# Patient Record
Sex: Female | Born: 1980 | Race: Black or African American | Hispanic: No | Marital: Single | Smoking: Never smoker
Health system: Southern US, Community
[De-identification: ages and names within clinical notes are randomized; demographics above are authoritative.]

## PROBLEM LIST (undated history)

## (undated) DIAGNOSIS — J45909 Unspecified asthma, uncomplicated: Secondary | ICD-10-CM

## (undated) DIAGNOSIS — L732 Hidradenitis suppurativa: Secondary | ICD-10-CM

## (undated) DIAGNOSIS — F32A Depression, unspecified: Secondary | ICD-10-CM

## (undated) DIAGNOSIS — B009 Herpesviral infection, unspecified: Secondary | ICD-10-CM

## (undated) DIAGNOSIS — T7840XA Allergy, unspecified, initial encounter: Secondary | ICD-10-CM

## (undated) DIAGNOSIS — F329 Major depressive disorder, single episode, unspecified: Secondary | ICD-10-CM

## (undated) HISTORY — DX: Herpesviral infection, unspecified: B00.9

## (undated) HISTORY — DX: Hidradenitis suppurativa: L73.2

## (undated) HISTORY — DX: Unspecified asthma, uncomplicated: J45.909

## (undated) HISTORY — DX: Allergy, unspecified, initial encounter: T78.40XA

## (undated) HISTORY — DX: Major depressive disorder, single episode, unspecified: F32.9

## (undated) HISTORY — PX: WISDOM TOOTH EXTRACTION: SHX21

## (undated) HISTORY — DX: Depression, unspecified: F32.A

## (undated) HISTORY — PX: OTHER SURGICAL HISTORY: SHX169

---

## 2004-08-13 ENCOUNTER — Emergency Department (HOSPITAL_COMMUNITY): Admission: EM | Admit: 2004-08-13 | Discharge: 2004-08-14 | Payer: Self-pay | Admitting: Emergency Medicine

## 2004-08-20 ENCOUNTER — Ambulatory Visit (HOSPITAL_COMMUNITY): Admission: RE | Admit: 2004-08-20 | Discharge: 2004-08-20 | Payer: Self-pay | Admitting: Orthopedic Surgery

## 2004-08-20 ENCOUNTER — Ambulatory Visit (HOSPITAL_BASED_OUTPATIENT_CLINIC_OR_DEPARTMENT_OTHER): Admission: RE | Admit: 2004-08-20 | Discharge: 2004-08-20 | Payer: Self-pay | Admitting: Orthopedic Surgery

## 2004-10-29 ENCOUNTER — Ambulatory Visit (HOSPITAL_COMMUNITY): Admission: RE | Admit: 2004-10-29 | Discharge: 2004-10-29 | Payer: Self-pay | Admitting: Orthopedic Surgery

## 2004-10-29 ENCOUNTER — Ambulatory Visit (HOSPITAL_BASED_OUTPATIENT_CLINIC_OR_DEPARTMENT_OTHER): Admission: RE | Admit: 2004-10-29 | Discharge: 2004-10-29 | Payer: Self-pay | Admitting: Orthopedic Surgery

## 2005-01-13 ENCOUNTER — Other Ambulatory Visit: Admission: RE | Admit: 2005-01-13 | Discharge: 2005-01-13 | Payer: Self-pay | Admitting: Obstetrics & Gynecology

## 2011-10-16 ENCOUNTER — Other Ambulatory Visit: Payer: Self-pay | Admitting: Obstetrics and Gynecology

## 2011-10-22 ENCOUNTER — Telehealth: Payer: Self-pay | Admitting: Obstetrics and Gynecology

## 2011-10-22 NOTE — Telephone Encounter (Signed)
VM from pt.  States was taking ATB.  Gets frequent yeast infections. Had Rx for Diflucan but no RF.  Requesting RX.

## 2011-10-22 NOTE — Telephone Encounter (Signed)
Spoke with pt rgd msg pt states take abx before cycle pt states having yeast infection wants rx for diflucan advised pt will consult with vph and call her back pt voice understanding

## 2011-10-22 NOTE — Telephone Encounter (Signed)
VM from pt.

## 2011-10-23 NOTE — Telephone Encounter (Signed)
Per conversation at [facility], Diflucan 150 mg po stat x1 prn antibiotic use for hidradenitis.  Dispense #3 with 3 refills

## 2011-10-26 DIAGNOSIS — B009 Herpesviral infection, unspecified: Secondary | ICD-10-CM | POA: Insufficient documentation

## 2011-10-26 DIAGNOSIS — J45909 Unspecified asthma, uncomplicated: Secondary | ICD-10-CM | POA: Insufficient documentation

## 2011-10-26 DIAGNOSIS — L732 Hidradenitis suppurativa: Secondary | ICD-10-CM | POA: Insufficient documentation

## 2011-10-26 MED ORDER — FLUCONAZOLE 150 MG PO TABS: 150.0000 mg | ORAL_TABLET | Freq: Once | ORAL | Status: DC

## 2011-10-26 NOTE — Addendum Note (Signed)
Addended by: Rolla Plate on: 10/26/2011 10:00 AM   Modules accepted: Orders

## 2011-10-26 NOTE — Telephone Encounter (Signed)
Spoke with pt rgd previous call informed pt rx sent to pharm pt voice understanding

## 2012-08-30 DIAGNOSIS — Z9109 Other allergy status, other than to drugs and biological substances: Secondary | ICD-10-CM | POA: Insufficient documentation

## 2013-10-06 DIAGNOSIS — Z113 Encounter for screening for infections with a predominantly sexual mode of transmission: Secondary | ICD-10-CM | POA: Insufficient documentation

## 2013-10-06 DIAGNOSIS — Z124 Encounter for screening for malignant neoplasm of cervix: Secondary | ICD-10-CM | POA: Insufficient documentation

## 2013-10-06 DIAGNOSIS — Z23 Encounter for immunization: Secondary | ICD-10-CM | POA: Insufficient documentation

## 2016-03-09 ENCOUNTER — Ambulatory Visit (INDEPENDENT_AMBULATORY_CARE_PROVIDER_SITE_OTHER): Payer: BLUE CROSS/BLUE SHIELD | Admitting: Family Medicine

## 2016-03-09 ENCOUNTER — Encounter: Payer: Self-pay | Admitting: Family Medicine

## 2016-03-09 VITALS — BP 120/70 | HR 82 | Resp 12 | Ht 68.0 in | Wt 192.0 lb

## 2016-03-09 DIAGNOSIS — Z23 Encounter for immunization: Secondary | ICD-10-CM | POA: Diagnosis not present

## 2016-03-09 DIAGNOSIS — Z13220 Encounter for screening for lipoid disorders: Secondary | ICD-10-CM

## 2016-03-09 DIAGNOSIS — Z0000 Encounter for general adult medical examination without abnormal findings: Secondary | ICD-10-CM | POA: Diagnosis not present

## 2016-03-09 DIAGNOSIS — Z131 Encounter for screening for diabetes mellitus: Secondary | ICD-10-CM | POA: Diagnosis not present

## 2016-03-09 NOTE — Patient Instructions (Signed)
A few things to remember from today's visit:   Physical exam, annual  Screening for lipid disorders - Plan: Lipid panel, Basic metabolic panel  Diabetes mellitus screening - Plan: Lipid panel, Basic metabolic panel    At least 150 minutes of moderate exercise per week, daily brisk walking for 15-30 min is a good exercise option. Healthy diet low in saturated (animal) fats and sweets and consisting of fresh fruits and vegetables, lean meats such as fish and white chicken and whole grains.   - Vaccines:  Tdap vaccine every 10 years.  Shingles vaccine recommended at age 36, could be given after 36 years of age but not sure about insurance coverage.  Pneumonia vaccines:  Prevnar 13 at 65 and Pneumovax at 66.  Screening recommendations for low/normal risk women:  Screening for diabetes at age 10240-45 and every 3 years.  Cervical cancer prevention:  -HPV vaccination between 349-239 years old. -Pap smear starts at 36 years of age and continues periodically until 36 years old in low risk women. Pap smear every 3 years between 7221 and 36 years old. Pap smear every 3 years between women 30 and older if pap smear negative and HPV screening negative.   -Breast cancer: Mammogram: There is disagreement between experts about when to start screening in low risk asymptomatic female but recent recommendations are to start screening at 440 and not later than 36 years old , every 1-2 years and after 36 yo q 2 years. Screening is recommended until 36 years old but some women can continue screening depending of healthy issues.   Colon cancer screening: starts at 36 years old until 36 years old.  Cholesterol disorder screening at age 36 and every 3 years.    Please be sure medication list is accurate. If a new problem present, please set up appointment sooner than planned today.

## 2016-03-09 NOTE — Progress Notes (Signed)
Pre visit review using our clinic review tool, if applicable. No additional management support is needed unless otherwise documented below in the visit note. 

## 2016-03-09 NOTE — Progress Notes (Addendum)
HPI:   [name] is a 36 y.o. female, who is here today with her 2 sons to establish Strong and for routine physical.  Former PCP: [facility].Dr [facility]. Last preventive routine visit: 2 years ago.  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 Also Hx of allergies,she follows with immunologists and gets allergy shot weekly. She also has Hx of knee pain,L>R , and left ankle pain.She follows with ortho and planning on having arthroscopy left knee in the next few weeks.   She follows with gyn annually for her female preventive Strong,last seen 08/2015.  She plays volleyball once per week for about 3 hours. She is not consistent with a healthy diet. She lives with her 2 sons (57 and 36 years old) and 2 cousins.  Reporting HIV test done in 2015.  Concerns today: Vaccination. She needs influenza vaccine, she thinks her Tdap is up to date but not sure about date.     Review of Systems  Constitutional: Negative for appetite change, fatigue and fever.  HENT: Positive for congestion and rhinorrhea. Negative for dental problem, hearing loss, mouth sores, trouble swallowing and voice change.   Eyes: Negative for photophobia, pain and visual disturbance.  Respiratory: Negative for cough, shortness of breath and wheezing.   Cardiovascular: Negative for chest pain and leg swelling.  Gastrointestinal: Negative for abdominal pain, nausea and vomiting.       No changes in bowel habits.  Endocrine: Negative for cold intolerance, heat intolerance, polydipsia, polyphagia and polyuria.  Genitourinary: Negative for decreased urine volume, dysuria and hematuria.  Musculoskeletal: Positive for arthralgias (knees and ankle (L)). Negative for gait problem and neck pain.  Skin: Negative for color change and rash.  Allergic/Immunologic: Positive for environmental allergies.  Neurological: Negative for  syncope, weakness and headaches.  Hematological: Negative for adenopathy. Does not bruise/bleed easily.  Psychiatric/Behavioral: Negative for confusion and sleep disturbance. The patient is not nervous/anxious.   All other systems reviewed and are negative.     No current outpatient prescriptions on file prior to visit.   No current facility-administered medications on file prior to visit.      Past Medical History:  Diagnosis Date  . Allergy   . Asthma   . Depression   . Herpes simplex type 1 infection   . Hydradenitis    No Known Allergies  Family History  Problem Relation Age of Onset  . Hypertension Mother   . Hypertension Father   . Cancer Father     Lymphoma    Social History   Social History  . Marital status: Single    Spouse name: N/A  . Number of children: N/A  . Years of education: N/A   Social History Main Topics  . Smoking status: Never Smoker  . Smokeless tobacco: Never Used  . Alcohol use Yes  . Drug use: No  . Sexual activity: Not Asked   Other Topics Concern  . None   Social History Narrative  . None    Vitals:   03/09/16 1510  BP: 120/70  Pulse: 82  Resp: 12   O2 sat 98% at RA. Body mass index is 29.19 kg/m.   Physical Exam  Nursing note and vitals reviewed. Constitutional: She is oriented to person, place, and time. She appears well-developed. No distress.  HENT:  Head: Atraumatic.  Right Ear: Hearing, tympanic membrane, external ear and ear canal normal.  Left Ear: Hearing, tympanic membrane, external ear and ear canal normal.  Mouth/Throat: Uvula is midline, oropharynx is clear and moist and mucous membranes are normal.  Tonsil stone right tonsil.  Eyes: Conjunctivae and EOM are normal. Pupils are equal, round, and reactive to light.  Neck: No tracheal deviation present. No thyroid mass and no thyromegaly present.  Cardiovascular: Normal rate and regular rhythm.   No murmur heard. Pulses:      Dorsalis pedis pulses  are 2+ on the right side, and 2+ on the left side.  Respiratory: Effort normal and breath sounds normal. No respiratory distress.  GI: Soft. She exhibits no mass. There is no hepatomegaly. There is no tenderness.  Musculoskeletal: She exhibits no edema.  No major deformity or signs of synovitis appreciated. Knee with normal ROM, no pain elicited and no crepitus.  Lymphadenopathy:    She has no cervical adenopathy.       Right: No supraclavicular adenopathy present.       Left: No supraclavicular adenopathy present.  Neurological: She is alert and oriented to person, place, and time. She has normal strength. No cranial nerve deficit or sensory deficit. Coordination and gait normal.  Reflex Scores:      Bicep reflexes are 2+ on the right side and 2+ on the left side.      Patellar reflexes are 2+ on the right side and 2+ on the left side. Skin: Skin is warm. No rash noted. No erythema.  Tattoos different parts of her body.   Psychiatric: She has a normal mood and affect. Her speech is normal. Cognition and memory are normal.  Well groomed, good eye contact.     ASSESSMENT AND PLAN:   Lisa Strong was seen today for establish Strong.  Diagnoses and all orders for this visit:  Physical exam, annual   We discussed the importance of regular physical activity and healthy diet for prevention of chronic illness and/or complications. Preventive guidelines reviewed. Vaccination updated. Continue following with gyn for her routine female preventive Strong. At the time of this visit I have not received records from former PCP. Next CPE in 1-2 years.   Screening for lipid disorders -     Lipid panel; Future -     Basic metabolic panel; Future  Diabetes mellitus screening -     Lipid panel; Future -     Basic metabolic panel; Future  Need for prophylactic vaccination and inoculation against influenza -     Flu Vaccine QUAD 36+ mos IM       Lisa Strong. [place], MD  Lisa Strong. Lisa Strong.

## 2016-03-10 LAB — LIPID PANEL
CHOLESTEROL: 179 mg/dL (ref 0–200)
HDL: 52.6 mg/dL (ref >39.00)
LDL Cholesterol: 115 mg/dL — ABNORMAL HIGH (ref 0–99)
NonHDL: 126.55
TRIGLYCERIDES: 59 mg/dL (ref 0.0–149.0)
Total CHOL/HDL Ratio: 3
VLDL: 11.8 mg/dL (ref 0.0–40.0)

## 2016-03-10 LAB — BASIC METABOLIC PANEL
BUN: 11 mg/dL (ref 6–23)
CO2: 29 meq/L (ref 19–32)
Calcium: 8.9 mg/dL (ref 8.4–10.5)
Chloride: 104 mEq/L (ref 96–112)
Creatinine, Ser: 0.69 mg/dL (ref 0.40–1.20)
GFR: 124.16 mL/min (ref >60.00)
GLUCOSE: 94 mg/dL (ref 70–99)
POTASSIUM: 3.9 meq/L (ref 3.5–5.1)
SODIUM: 140 meq/L (ref 135–145)

## 2016-03-10 NOTE — Addendum Note (Signed)
Addended by: Bonnye FavaKWEI, NANA K on: 03/10/2016 08:23 AM   Modules accepted: Orders

## 2016-03-12 ENCOUNTER — Encounter: Payer: Self-pay | Admitting: Family Medicine

## 2016-06-03 ENCOUNTER — Ambulatory Visit (INDEPENDENT_AMBULATORY_CARE_PROVIDER_SITE_OTHER): Payer: BLUE CROSS/BLUE SHIELD | Admitting: Sports Medicine

## 2016-06-03 ENCOUNTER — Encounter: Payer: Self-pay | Admitting: Sports Medicine

## 2016-06-03 DIAGNOSIS — M25579 Pain in unspecified ankle and joints of unspecified foot: Secondary | ICD-10-CM

## 2016-06-05 NOTE — Progress Notes (Signed)
   Subjective:    Patient ID: Lisa Strong, female    DOB: 07/03/1980, 36 y.o.   MRN: [id number]  HPI chief complaint: Bilateral foot pain  36 year old female comes in today complaining of long-standing bilateral foot pain. She has a history of plantar fasciitis. She localizes her pain primarily to the arch of each foot. She denies any heel pain. No recent trauma. No numbness or tingling. She is here today to discuss orthotics.  Past medical history reviewed Medications reviewed Allergies reviewed     Review of Systems As above     Objective:   Physical Exam  Well-developed, well-nourished. No acute distress. Awake alert and oriented 3. Vital signs reviewed  Examination of both feet in the standing position shows mild pes planus. She is slightly tender to palpation diffusely along the arch of her foot but no tenderness to palpation at the calcaneus. Negative calcaneal squeeze. Negative tinels over the tarsal tunnel. No soft tissue swelling. Neurovascularly intact distally. Walking without a limp.      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 I've given her an arch strap to try as well. Follow-up in one month. If she finds the green sports insoles to be comfortable then we will consider custom orthotics at that time.

## 2016-06-22 ENCOUNTER — Encounter: Payer: Self-pay | Admitting: Family Medicine

## 2016-06-22 ENCOUNTER — Ambulatory Visit (INDEPENDENT_AMBULATORY_CARE_PROVIDER_SITE_OTHER): Payer: BLUE CROSS/BLUE SHIELD | Admitting: Family Medicine

## 2016-06-22 VITALS — BP 130/80 | HR 81 | Temp 98.1°F | Resp 12 | Ht 68.0 in | Wt 192.1 lb

## 2016-06-22 DIAGNOSIS — J069 Acute upper respiratory infection, unspecified: Secondary | ICD-10-CM

## 2016-06-22 DIAGNOSIS — H6983 Other specified disorders of Eustachian tube, bilateral: Secondary | ICD-10-CM

## 2016-06-22 DIAGNOSIS — J4520 Mild intermittent asthma, uncomplicated: Secondary | ICD-10-CM | POA: Diagnosis not present

## 2016-06-22 DIAGNOSIS — J309 Allergic rhinitis, unspecified: Secondary | ICD-10-CM | POA: Insufficient documentation

## 2016-06-22 MED ORDER — METHYLPREDNISOLONE ACETATE 80 MG/ML IJ SUSP
40.0000 mg | Freq: Once | INTRAMUSCULAR | Status: AC
  Administered 2016-06-22: 40 mg via INTRAMUSCULAR

## 2016-06-22 MED ORDER — ALBUTEROL SULFATE HFA 108 (90 BASE) MCG/ACT IN AERS: 2.0000 | INHALATION_SPRAY | Freq: Four times a day (QID) | RESPIRATORY_TRACT | 0 refills | Status: DC | PRN

## 2016-06-22 MED ORDER — PREDNISONE 20 MG PO TABS: 40.0000 mg | ORAL_TABLET | Freq: Every day | ORAL | 0 refills | Status: AC

## 2016-06-22 NOTE — Patient Instructions (Addendum)
A few things to remember from today's visit:   Mild intermittent asthma, unspecified whether complicated - Plan: albuterol (PROVENTIL HFA;VENTOLIN HFA) 108 (90 Base) MCG/ACT inhaler  Allergic rhinitis, unspecified seasonality, unspecified trigger - Plan: predniSONE (DELTASONE) 20 MG tablet  Eustachian tube dysfunction, bilateral   Allegra 180 mg daily in the morning. Start Prednisone tomorrow.  Nasal irrigations with saline.  Please follow in 2 weeks of not any better.  Albuterol inh 2 puff every 6 hours for a week then as needed for wheezing or shortness of breath.   There are 2 forms of allergic rhinitis:    . Seasonal (hay fever): Caused by an allergy to pollen and/or mold spores in the air. Pollen is the fine powder that comes from the stamen of flowering plants. It can be carried through the air and is easily inhaled. Symptoms are seasonal and usually occur in spring, late summer, and fall. Marland Kitchen. Perennial: Caused by other allergens such as dust mites, pet hair or dander, or mold. Symptoms occur year-round.  Symptoms: Your symptoms can vary, depending on the severity of your allergies. Symptoms can include: Sneezing, coughing.itching (mostly eyes, nose, mouth, throat and skin),runny nose,stuffy nose.headache,pressure in the nose and cheeks,ear fullness and popping, sore throat.watery, red, or swollen eyes,dark circles under your eyes,trouble smelling, and sometimes hives.  Allergic rhinitis cannot be prevented. You can help your symptoms by avoiding the things that you are allergic, including: . Keeping windows closed. This is especially important during high-pollen seasons. . Washing your hands after petting animals. . Using dust- and mite-proof bedding and mattress covers. . Wearing glasses outside to protect your eyes. . Showering before bed to wash off allergens from hair and skin. You can also avoid things that can make your symptoms worse, such as: . aerosol sprays . air  pollution . cold temperatures . humidity . irritating fumes . tobacco smoke . wind . wood smoke.   Antihistamines help reduce the sneezing, runny nose, and itchiness of allergies. These come in pill form and as nasal sprays. Allegra,Zyrtec,or Claritin are some examples. Decongestants, such as pseudoephedrine and phenylephrine, help temporarily relieve the stuffy nose of allergies. Decongestants are found in many medicines and come as pills, nose sprays, and nose drops. They could increase heart rate and cause tachycardia and tremor. Nasal Afrin should not be used for more than 3 days because you can become dependent on them. This causes you to feel even more stopped-up when you try to quit using them.  Nasal sprays: steroids or antihistaminics. Over the counter intranasal sterids: Nasocort,Rhinocort,or Flonase.You won't notice their benefits for up to 2 weeks after starting them. Allergy shots or sublingual tablets when other treatment do not help.This is done by immunologists.    Please be sure medication list is accurate. If a new problem present, please set up appointment sooner than planned today.

## 2016-06-22 NOTE — Progress Notes (Signed)
HPI:  ACUTE VISIT  Chief Complaint  Patient presents with  . sinuses    Lisa Strong is a 36 y.o.female here today complaining of about a week of respiratory symptoms. [place]. She had scratchy throat when symptoms fists started but better now.   URI   This is a new problem. The current episode started in the past 7 days. The problem has been unchanged. There has been no fever. Associated symptoms include congestion, coughing, ear pain, headaches (frontal pressure headache,mild), a plugged ear sensation, rhinorrhea and wheezing. Pertinent negatives include no abdominal pain, diarrhea, nausea, neck pain, rash, sinus pain, sneezing, sore throat, swollen glands or vomiting. She has tried decongestant for the symptoms. The treatment provided mild relief.   Ears "clotted up" and earache, bilateral. She denies ear discharged.  + Achy chest with coughing spells. + Wheezing, denies dyspnea.   No Hx of recent travel. + Sick contact, several family members have been sick with URI like symptom. No known insect bite.  Hx of allergies: Yes, Hx of asthma. She has not used Albuterol inh recently.  OTC medications for this problem: Nyquil and Dyquil, also Sudafed.  Symptoms otherwise stable.   Review of Systems  Constitutional: Positive for fatigue. Negative for appetite change, chills and fever.  HENT: Positive for congestion, ear pain, hearing loss (mild.), postnasal drip and rhinorrhea. Negative for facial swelling, mouth sores, sinus pain, sneezing and sore throat.   Eyes: Negative for redness and visual disturbance.  Respiratory: Positive for cough and wheezing. Negative for chest tightness and shortness of breath.   Gastrointestinal: Negative for abdominal pain, diarrhea, nausea and vomiting.  Musculoskeletal: Negative for myalgias and neck pain.  Skin: Negative for rash.  Allergic/Immunologic: Positive for environmental allergies.    Neurological: Positive for headaches (frontal pressure headache,mild). Negative for dizziness, syncope, facial asymmetry and weakness.  Hematological: Negative for adenopathy. Does not bruise/bleed easily.  Psychiatric/Behavioral: Negative for confusion.      Current Outpatient Prescriptions on File Prior to Visit  Medication Sig Dispense Refill  . EPINEPHrine (EPIPEN 2-PAK) 0.3 mg/0.3 mL IJ SOAJ injection EpiPen 2-Pak 0.3 mg/0.3 mL injection, auto-injector    . ketotifen (ZADITOR) 0.025 % ophthalmic solution Zaditor 0.025 % (0.035 %) eye drops  INT 1 GTT INTO EACH EYE BID    . Lorcaserin HCl (BELVIQ) 10 MG TABS Belviq 10 mg tablet  TK 1 T PO  BID     No current facility-administered medications on file prior to visit.      Past Medical History:  Diagnosis Date  . Allergy   . Asthma   . Depression   . Herpes simplex type 1 infection   . Hydradenitis    Allergies  Allergen Reactions  . Bee Pollen     Social History   Social History  . Marital status: Single    Spouse name: N/A  . Number of children: N/A  . Years of education: N/A   Social History Main Topics  . Smoking status: Never Smoker  . Smokeless tobacco: Never Used  . Alcohol use Yes  . Drug use: No  . Sexual activity: Not Asked   Other Topics Concern  . None   Social History Narrative  . None    Vitals:   06/22/16 1522  BP: 130/80  Pulse: 81  Resp: 12  Temp: 98.1 F (36.7 C)  O2 sat at RA 98% Body mass index is 29.21 kg/m.  Physical Exam  Nursing note and vitals reviewed. Constitutional: She is oriented to person, place, and time. She appears well-developed. She does not appear ill. No distress.  HENT:  Head: Atraumatic.  Right Ear: External ear and ear canal normal. Tympanic membrane is not erythematous and not bulging. A middle ear effusion is present.  Left Ear: External ear and ear canal normal. Tympanic membrane is bulging. Tympanic membrane is not erythematous. A middle ear  effusion is present.  Nose: Rhinorrhea present. Right sinus exhibits no maxillary sinus tenderness and no frontal sinus tenderness. Left sinus exhibits no maxillary sinus tenderness and no frontal sinus tenderness.  Mouth/Throat: Oropharynx is clear and moist and mucous membranes are normal.  Enlarged turbinates. Post nasal drainage.  Nasal voice.   Eyes: Conjunctivae and EOM are normal.  Neck: No muscular tenderness present. No edema present.  Cardiovascular: Normal rate and regular rhythm.   No murmur heard. Respiratory: Effort normal and breath sounds normal. No stridor. No respiratory distress.  Lymphadenopathy:       Head (right side): No submandibular adenopathy present.       Head (left side): No submandibular adenopathy present.    She has no cervical adenopathy.  Neurological: She is alert and oriented to person, place, and time. She has normal strength. No cranial nerve deficit. Coordination and gait normal.  Skin: Skin is warm. No rash noted. No erythema.  Psychiatric: She has a normal mood and affect. Her speech is normal.  Well groomed, good eye contact.     ASSESSMENT AND PLAN:   Lisa Strong was seen today for sinuses.  Diagnoses and all orders for this visit:  Mild intermittent asthma, unspecified whether complicated  Today I do not appreciate wheezing or other lung abnormality. She would like steroid injection here in the office, so Depo Medrol 40 mg IM given. Albuterol inh 2 puff every 6 hours for a week then as needed for wheezing or shortness of breath.   Tomorrow she will start oral Prednisone.Some side effects of medications discussed. Instructed about warning signs. F/U as needed.  -     albuterol (PROVENTIL HFA;VENTOLIN HFA) 108 (90 Base) MCG/ACT inhaler; Inhale 2 puffs into the lungs every 6 (six) hours as needed for wheezing or shortness of breath. -     methylPREDNISolone acetate (DEPO-MEDROL) injection 40 mg; Inject 0.5 mLs (40 mg total) into the  muscle once.  Allergic rhinitis, unspecified seasonality, unspecified trigger  OTC Allegra 180 mg daily and saline nasal irrigations recommended. Prednisone may also help with acute symptoms.  -     predniSONE (DELTASONE) 20 MG tablet; Take 2 tablets (40 mg total) by mouth daily with breakfast. -     methylPREDNISolone acetate (DEPO-MEDROL) injection 40 mg; Inject 0.5 mLs (40 mg total) into the muscle once.  Eustachian tube dysfunction, bilateral  No findings that suggest barotrauma, gross hearing at conversation level intact. Clearly instructed about warning signs. Treatment options discussed, OTC decongestants,autoinflation maneuvers,chewing gum among some.Some side effects of decongestants discussed. Prednisone may help.  -     predniSONE (DELTASONE) 20 MG tablet; Take 2 tablets (40 mg total) by mouth daily with breakfast.  URI, acute  Most likely viral, given the fact family members have had similar symptoms. Allergies in general may be aggravating symptoms. I do not think abx treatment is needed at this time. Explained that cough and congestion can last a few days or weeks. Monitor for fever or new symptoms. F/U as needed.    -Lisa Strong  was advised to seek attention immediately if symptoms worsen, to follow if symptoms persist or new concerns arise.       Betty G. [place], MD  [facility]. [place] [facility].

## 2016-06-24 ENCOUNTER — Encounter: Payer: BLUE CROSS/BLUE SHIELD | Admitting: Sports Medicine

## 2016-07-20 ENCOUNTER — Ambulatory Visit (INDEPENDENT_AMBULATORY_CARE_PROVIDER_SITE_OTHER): Payer: BLUE CROSS/BLUE SHIELD | Admitting: Sports Medicine

## 2016-07-20 VITALS — BP 112/80 | Ht 68.0 in | Wt 190.0 lb

## 2016-07-20 DIAGNOSIS — M25579 Pain in unspecified ankle and joints of unspecified foot: Secondary | ICD-10-CM | POA: Diagnosis not present

## 2016-07-20 NOTE — Progress Notes (Signed)
Patient ID: Lisa Strong, female   DOB: 11/18/1980, 36 y.o.   MRN: [id number]  Patient comes in today for custom orthotics. Please see the last office note for details regarding history and physical exam findings. In short, this patient has a history of bilateral foot pain secondary to arch strain. She was recently placed into some green sports insoles with scaphoid pads. She felt like the scaphoid pad was a little too prominent but otherwise did well with them.  Custom orthotics were constructed for her today. A total of 30 minutes was spent with the patient with greater than 50% of the time spent in face-to-face consultation discussing orthotic construction, instruction, and fitting. Evaluation of her gait after orthotic construction showed it to be a neutral gait. She found the orthotics to be very comfortable. She will continue with activity as tolerated and will follow-up as needed.  Patient was fitted for a : standard, cushioned, semi-rigid orthotic. The orthotic was heated and afterward the patient stood on the orthotic blank positioned on the orthotic stand. The patient was positioned in subtalar neutral position and 10 degrees of ankle dorsiflexion in a weight bearing stance. After completion of molding, a stable base was applied to the orthotic blank. The blank was ground to a stable position for weight bearing. Size: 8 Base: Blue EVA Posting: none Additional orthotic padding: none

## 2016-08-20 DIAGNOSIS — N760 Acute vaginitis: Secondary | ICD-10-CM

## 2016-08-20 DIAGNOSIS — B9689 Other specified bacterial agents as the cause of diseases classified elsewhere: Secondary | ICD-10-CM | POA: Insufficient documentation

## 2016-09-24 ENCOUNTER — Encounter: Payer: Self-pay | Admitting: Family Medicine

## 2016-11-09 ENCOUNTER — Encounter: Payer: Self-pay | Admitting: Family Medicine

## 2016-12-04 ENCOUNTER — Other Ambulatory Visit: Payer: Self-pay | Admitting: Obstetrics and Gynecology

## 2016-12-04 DIAGNOSIS — N6320 Unspecified lump in the left breast, unspecified quadrant: Secondary | ICD-10-CM

## 2016-12-04 DIAGNOSIS — N6310 Unspecified lump in the right breast, unspecified quadrant: Secondary | ICD-10-CM

## 2016-12-11 ENCOUNTER — Ambulatory Visit
Admission: RE | Admit: 2016-12-11 | Discharge: 2016-12-11 | Disposition: A | Discharge status: Home / Self Care | Payer: BLUE CROSS/BLUE SHIELD | Source: Ambulatory Visit | Attending: Obstetrics and Gynecology | Admitting: Obstetrics and Gynecology

## 2016-12-11 DIAGNOSIS — N6320 Unspecified lump in the left breast, unspecified quadrant: Secondary | ICD-10-CM

## 2016-12-11 DIAGNOSIS — N6310 Unspecified lump in the right breast, unspecified quadrant: Secondary | ICD-10-CM

## 2017-03-31 ENCOUNTER — Encounter: Payer: Self-pay | Admitting: Podiatry

## 2017-03-31 ENCOUNTER — Ambulatory Visit: Payer: BLUE CROSS/BLUE SHIELD | Admitting: Podiatry

## 2017-03-31 VITALS — BP 125/79 | HR 90

## 2017-03-31 DIAGNOSIS — L732 Hidradenitis suppurativa: Secondary | ICD-10-CM | POA: Insufficient documentation

## 2017-03-31 DIAGNOSIS — L603 Nail dystrophy: Secondary | ICD-10-CM

## 2017-03-31 DIAGNOSIS — R87619 Unspecified abnormal cytological findings in specimens from cervix uteri: Secondary | ICD-10-CM | POA: Insufficient documentation

## 2017-04-07 NOTE — Progress Notes (Signed)
   Subjective: 37 year old female presenting today as a new patient with a chief complaint of discolored nails 1-5 bilaterally that have been present for the past few years. 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 She has not done anything for treatment. There are no modifying factors noted and patient denies any pain. Patient is here for further evaluation and treatment.   Past Medical History:  Diagnosis Date  . Allergy   . Asthma   . Depression   . Herpes simplex type 1 infection   . Hydradenitis     Objective: Physical Exam General: The patient is alert and oriented x3 in no acute distress.  Dermatology: Hyperkeratotic, discolored, thickened, onychodystrophy of nails noted bilaterally.  Skin is warm, dry and supple bilateral lower extremities. Negative for open lesions or macerations.  Vascular: Palpable pedal pulses bilaterally. No edema or erythema noted. Capillary refill within normal limits.  Neurological: Epicritic and protective threshold grossly intact bilaterally.   Musculoskeletal Exam: Range of motion within normal limits to all pedal and ankle joints bilateral. Muscle strength 5/5 in all groups bilateral.   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 #2 Recommended good shoe gear. #3 Recommended keeping nails short to alleviate pressure.  #4 Return to clinic as needed.    Felecia ShellingBrent M. Canden Cieslinski, DPM [facility]  Dr. Felecia ShellingBrent M. Carston Riedl, DPM    [address]                                        [place], [place] [address]                Office [phone]  Fax [phone]

## 2017-12-08 DIAGNOSIS — N920 Excessive and frequent menstruation with regular cycle: Secondary | ICD-10-CM | POA: Insufficient documentation

## 2018-02-19 DIAGNOSIS — K5900 Constipation, unspecified: Secondary | ICD-10-CM | POA: Insufficient documentation

## 2018-10-27 NOTE — Progress Notes (Signed)
 Chief Complaint: Chief Complaint  Patient presents with  . Bilateral Foot Problem    room 6    HPI: Lisa Strong is a 38 y.o. female who presents for evaluation of bilateral fourth digit callusing as well as thickened mildly painful nails.  Patient says she has had nail fungus for many years and she would like to attempt some sort of treatment at this point in time.  She has some mild sharp localized pain of her calluses to the lateral aspect of 4 digits bilaterally worse with ambulation closed toed shoe gear.  Pain is improved with rest.  She has not attempted any other treatment options.  Patient denies any acute trauma or injury.  Patient denies nausea, vomiting, fever, chills, chest pain, or shortness of breath.  Past Medical History:  Diagnosis Date  . Allergic rhinitis   . Arthritis     Past Surgical History:  Procedure Laterality Date  . FINGER SURGERY    . KNEE ARTHROSCOPY      Current Outpatient Medications  Medication Sig Dispense Refill  . fluticasone (FLONASE SENSIMIST) 27.5 mcg/actuation nasal spray 2 sprays by Nasal route daily. 10 g 5  . ipratropium (ATROVENT) 0.03 % nasal spray 2 sprays by Nasal route 2 times daily. 30 mL 5  . polyethylene glycol (MIRALAX) 17 gram/dose powder Take 17 g by mouth daily as needed for Constipation. 510 g 1   No current facility-administered medications for this visit.     No Known Allergies  Family History  Problem Relation Age of Onset  . Asthma Mother   . Hypertension Mother   . Cancer Father   . Hyperlipidemia Maternal Aunt   . Hypertension Maternal Aunt   . Heart disease Neg Hx   . Diabetes Neg Hx     Social History   Tobacco Use  . Smoking status: Never Smoker  . Smokeless tobacco: Never Used  Substance Use Topics  . Alcohol use: Yes    Comment: social  . Drug use: Never    Review of Systems: A complete ROS was performed with pertinent positives/negatives noted in the HPI. The remainder of the ROS are  negative.  Physical Examination:  Constitutional:  Well nourished in no acute distress. Vitals:   10/27/18 1030  BP: 140/80  Pulse: 72  SpO2: 100%  PainSc: 0-Zero  Position: Sitting    Psychiatric: Orientated to time, place, person; Appropriate mood and affect today.  Bilateral Lower Extremity Exam:  Vascular: DP/PT pulses 2/4 bilateral.  CFT <3 seconds.  Normal hair growth on digits.  No edema.   Skin: Left third and fourth digit nails are thick dystrophic and discolored with subungual debris.  Callusing lateral aspect of fourth digits bilaterally.  Musculoskeletal:  MMT 5/5 bilateral lower extremities in DF, PF, Inversion, Eversion.  Decreased ROM in DF at ankle joint bilaterally.  Neurologic:  Sensation intact to light touch.   Assessment: Lisa Strong is a 38 y.o. female who presents with  1. Onychomycosis due to dermatophyte  Hepatic function panel  2. Callus of foot      Plan: Lisa Strong was evaluated, treated, and all questions were answered by myself. Radiographs and pertinent radiological exams were reviewed. Patient educated on clinical exam findings, diagnosis, and conservative vs surgical treatment options. Callus was trimmed with a 10 blade without incident.  Educated patient on keeping callus filed down with a pumice stone.  Educated on daily moisturizing with over-the-counter lotion. Discussed options for onychomycosis including monitoring,  topicals, and oral medications.  Discussed potential side effects and complications with medications. Patient would like to attempt oral Lamisil therapy.  Prescription provided for liver function test.  Patient to follow up in 3 months for re-evaluation.  Patient can call with any questions or should problems arise.  Patients in agreement with plan above.   Future Appt.:    No future appointments.   Lisa Strong, [place] Date: 10/27/2018  Time: 12:24 PM    Electronically signed by: Lisa Strong, DPM 10/27/18 1226

## 2019-06-19 ENCOUNTER — Ambulatory Visit: Payer: BLUE CROSS/BLUE SHIELD

## 2019-06-23 ENCOUNTER — Other Ambulatory Visit: Payer: Self-pay | Admitting: Chiropractic Medicine

## 2019-06-23 DIAGNOSIS — M545 Low back pain, unspecified: Secondary | ICD-10-CM

## 2019-06-26 ENCOUNTER — Ambulatory Visit: Payer: Self-pay | Attending: Internal Medicine

## 2019-06-26 DIAGNOSIS — Z23 Encounter for immunization: Secondary | ICD-10-CM

## 2019-06-26 NOTE — Progress Notes (Signed)
   Covid-19 Vaccination Clinic  Name:  Lisa Strong    MRN: [id number] DOB: 1980/10/24  06/26/2019  [name] was observed post Covid-19 immunization for 15 minutes without incident. She was provided with Vaccine Information Sheet and instruction to access the V-Safe system.   [name] was instructed to call 911 with any severe reactions post vaccine: Marland Kitchen Difficulty breathing  . Swelling of face and throat  . A fast heartbeat  . A bad rash all over body  . Dizziness and weakness   Immunizations Administered    Name Date Dose VIS Date Route   JANSSEN COVID-19 VACCINE 06/26/2019 11:59 AM 0.5 mL 03/04/2019 Intramuscular   Manufacturer: [employer]   Lot: [id number]   NDC: 03403-524-81

## 2019-07-24 ENCOUNTER — Ambulatory Visit
Admission: RE | Admit: 2019-07-24 | Discharge: 2019-07-24 | Disposition: A | Discharge status: Home / Self Care | Payer: BC Managed Care – PPO | Source: Ambulatory Visit | Attending: Chiropractic Medicine | Admitting: Chiropractic Medicine

## 2019-07-24 ENCOUNTER — Other Ambulatory Visit: Payer: Self-pay

## 2019-07-24 DIAGNOSIS — M545 Low back pain, unspecified: Secondary | ICD-10-CM

## 2019-07-31 DIAGNOSIS — N6011 Diffuse cystic mastopathy of right breast: Secondary | ICD-10-CM | POA: Insufficient documentation

## 2019-10-23 DIAGNOSIS — R3129 Other microscopic hematuria: Secondary | ICD-10-CM | POA: Insufficient documentation

## 2019-10-30 DIAGNOSIS — M25561 Pain in right knee: Secondary | ICD-10-CM | POA: Insufficient documentation

## 2020-04-08 DIAGNOSIS — M25662 Stiffness of left knee, not elsewhere classified: Secondary | ICD-10-CM | POA: Insufficient documentation

## 2021-07-04 IMAGING — MR MR LUMBAR SPINE W/O CM
4 of 5 series · 27 of 48 positions shown · non-contrast
Comparison: None.

CLINICAL DATA: Low back pain.

EXAM:
MRI LUMBAR SPINE WITHOUT CONTRAST
TECHNIQUE: Multiplanar, multisequence MR imaging of the lumbar spine was
performed. No intravenous contrast was administered.

[Series 3: T2 · sagittal · 4.0mm · 0.55mm/px · 6 of 12 slices shown (1 of 2)]
[im 1/12]
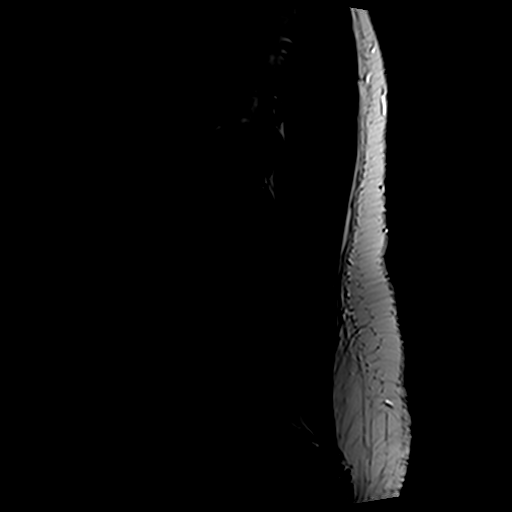
[im 3/12]
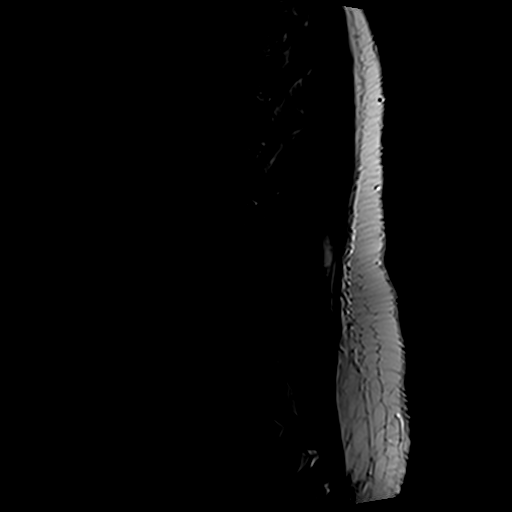
[im 5/12]
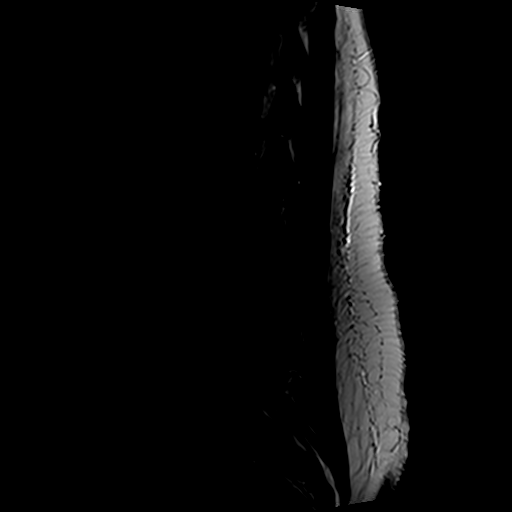
[im 7/12]
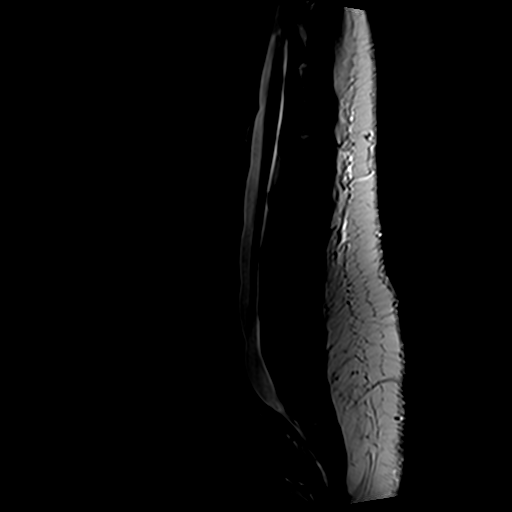
[im 9/12]
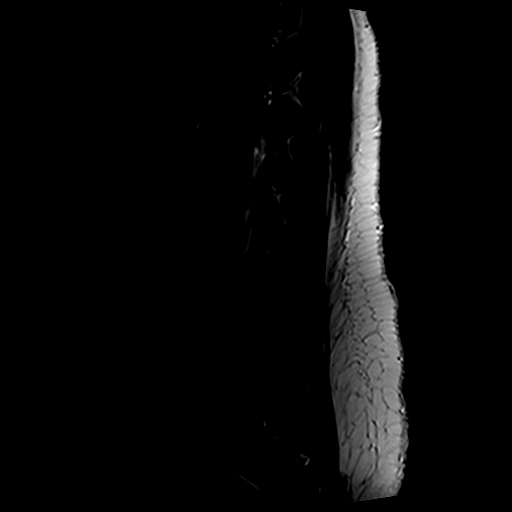
[im 12/12]
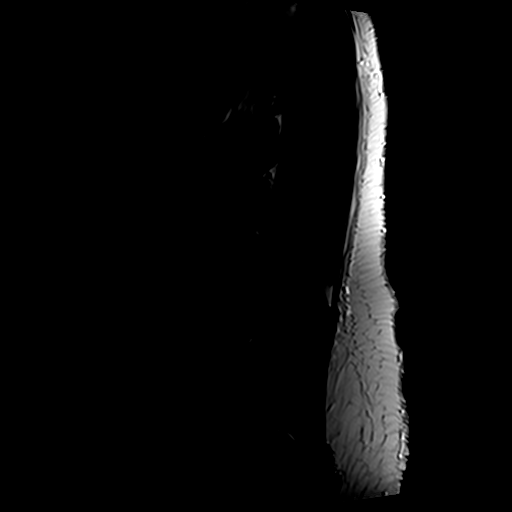

[Series 4: T1 · sagittal · 4.0mm · 0.55mm/px · 5 of 12 slices shown (1 of 2)]
[im 1/12]
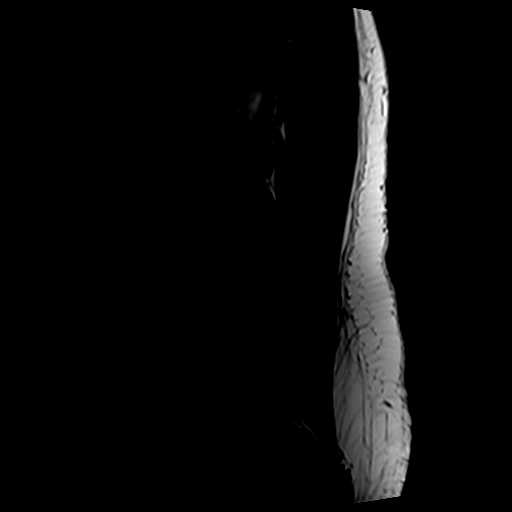
[im 3/12]
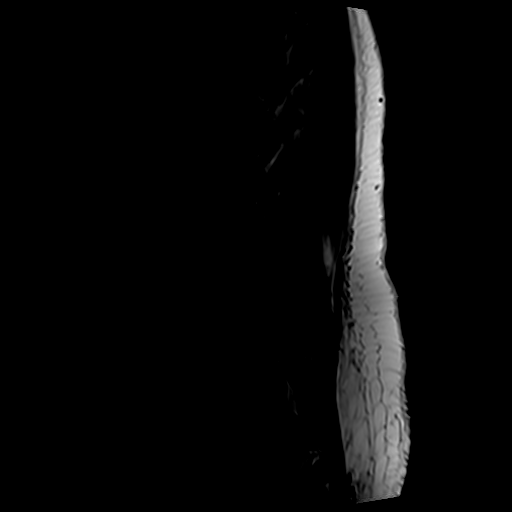
[im 6/12]
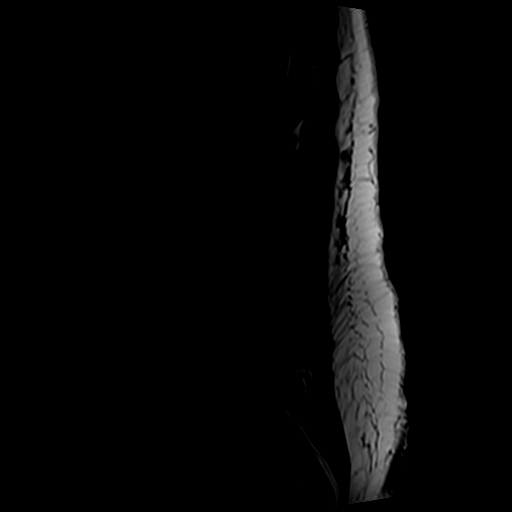
[im 9/12]
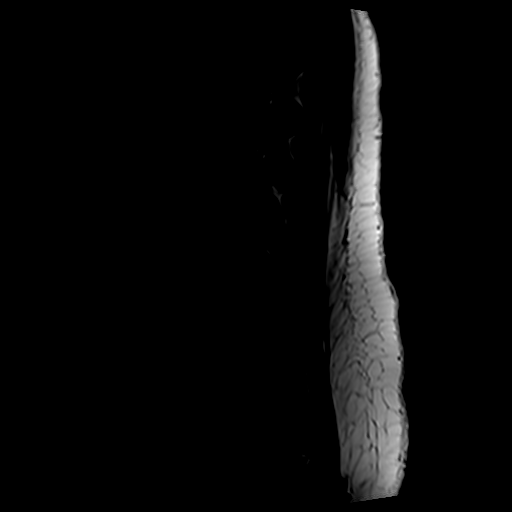
[im 12/12]
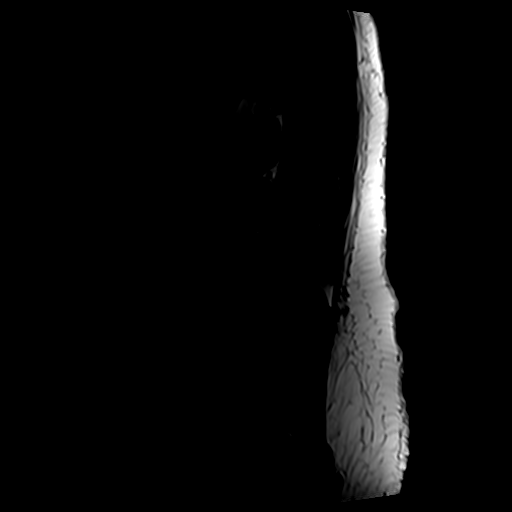

[Series 6: T2 · axial · 4.0mm · 0.70mm/px · z∈[-164,+31]mm · 10 of 37 slices shown (2 of 2)]
[im 3/37]
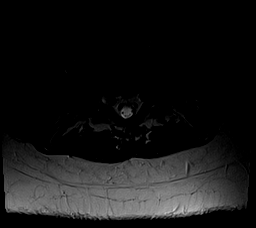
[im 5/37]
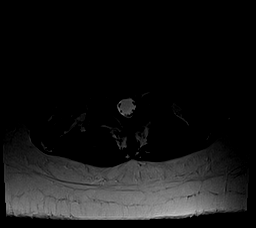
[im 8/37]
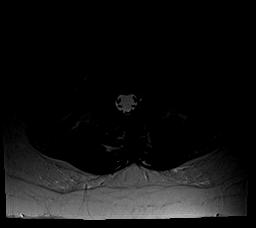
[im 13/37]
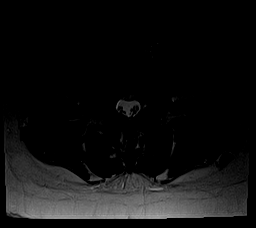
[im 17/37]
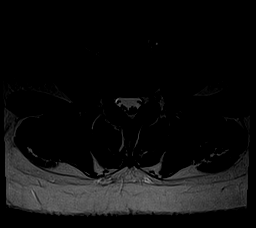
[im 20/37]
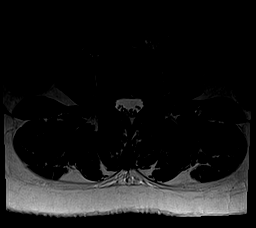
[im 22/37]
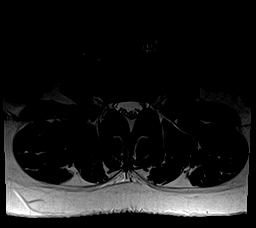
[im 27/37]
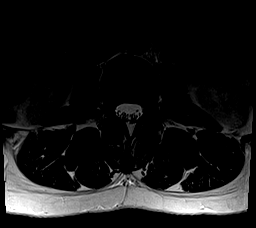
[im 32/37]
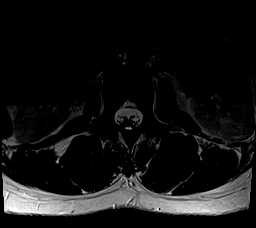
[im 37/37]
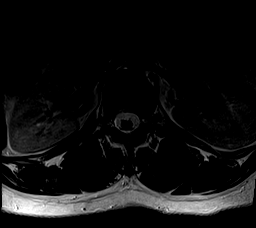

[Series 7: T1 · axial · 4.0mm · 0.35mm/px · z∈[-164,+6]mm · 6 of 37 slices shown (2 of 2)]
[im 3/37]
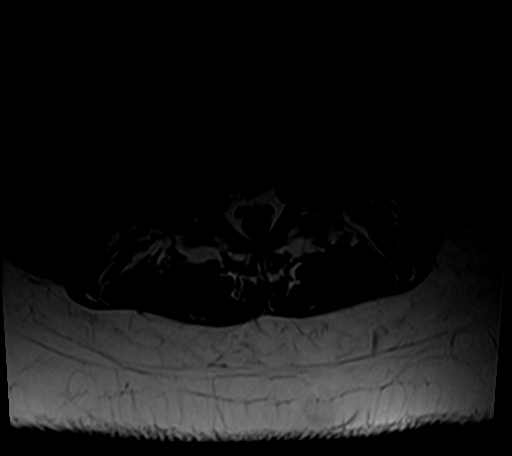
[im 5/37]
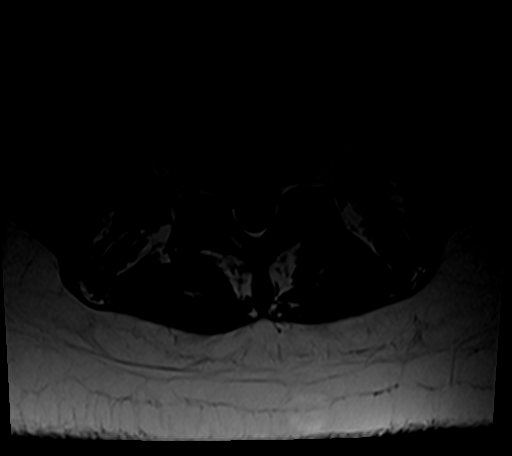
[im 8/37]
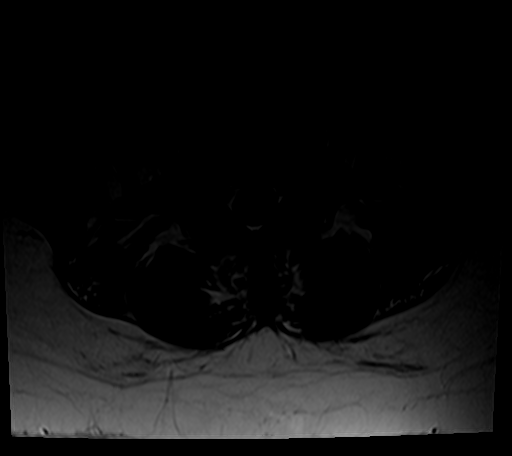
[im 13/37]
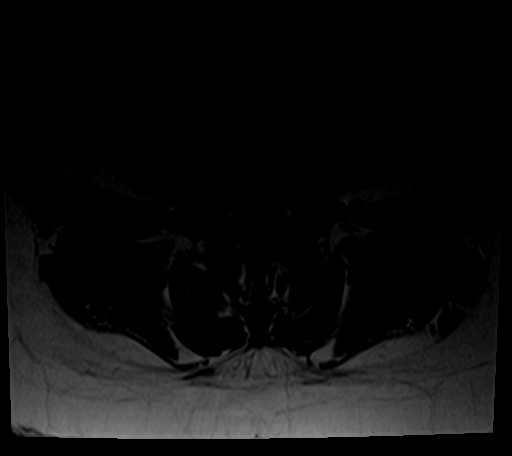
[im 20/37]
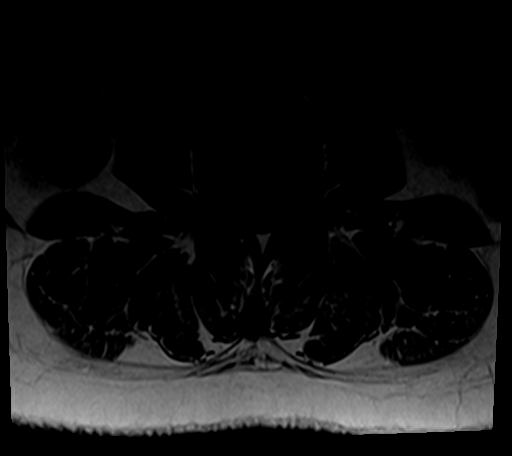
[im 32/37]
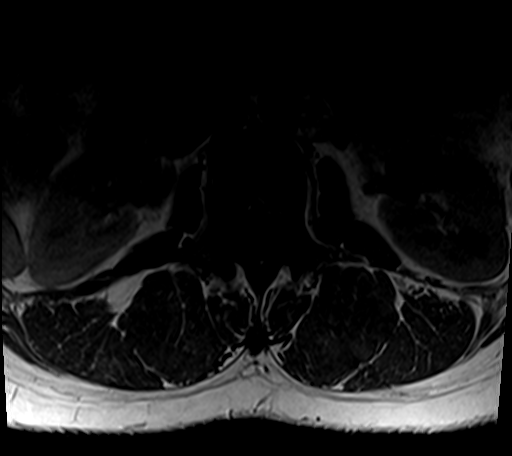

[27 of 48 positions shown; findings below may reference images not displayed]

FINDINGS: Segmentation:  Standard.

Alignment:  Physiologic.

Vertebrae:  No fracture, evidence of discitis, or bone lesion.

Conus medullaris and cauda equina: Conus extends to the L1-2 level.
Conus and cauda equina appear normal.

Paraspinal and other soft tissues: Negative.

Disc levels:

T12-L1: No spinal canal or neural foraminal stenosis.

L1-2: No spinal canal or neural foraminal stenosis.

L2-3: No spinal canal or neural foraminal stenosis.

L3-4: No spinal canal or neural foraminal stenosis.

L4-5: Shallow disc bulge without significant spinal canal or neural
foraminal stenosis.

L5-S1: Small right subarticular disc protrusion causing effacement
of the right subarticular zone and posterior displacement of the
right S1 nerve root. No significant neural foraminal narrowing.
IMPRESSION: 1. Small right subarticular disc protrusion at L5-S1 causing
effacement of the right subarticular zone and posterior displacement
of the right S1 nerve root.
2. No significant spinal canal or neural foraminal stenosis at any
level.

## 2021-12-12 DIAGNOSIS — I10 Essential (primary) hypertension: Secondary | ICD-10-CM | POA: Insufficient documentation

## 2022-12-11 ENCOUNTER — Other Ambulatory Visit (HOSPITAL_COMMUNITY): Payer: Self-pay | Admitting: Orthopedic Surgery

## 2022-12-11 DIAGNOSIS — T8484XD Pain due to internal orthopedic prosthetic devices, implants and grafts, subsequent encounter: Secondary | ICD-10-CM

## 2022-12-24 ENCOUNTER — Other Ambulatory Visit (HOSPITAL_COMMUNITY): Payer: BC Managed Care – PPO

## 2022-12-24 ENCOUNTER — Encounter (HOSPITAL_COMMUNITY)
Admission: RE | Admit: 2022-12-24 | Discharge: 2022-12-24 | Disposition: A | Discharge status: Home / Self Care | Payer: BC Managed Care – PPO | Source: Ambulatory Visit | Attending: Orthopedic Surgery | Admitting: Orthopedic Surgery

## 2022-12-24 DIAGNOSIS — T8484XD Pain due to internal orthopedic prosthetic devices, implants and grafts, subsequent encounter: Secondary | ICD-10-CM | POA: Insufficient documentation

## 2022-12-24 MED ORDER — TECHNETIUM TC 99M MEDRONATE IV KIT
22.0000 | PACK | Freq: Once | INTRAVENOUS | Status: AC | PRN
  Administered 2022-12-24: 22 via INTRAVENOUS

## 2023-04-27 ENCOUNTER — Other Ambulatory Visit: Payer: Self-pay | Admitting: Obstetrics and Gynecology

## 2023-04-27 ENCOUNTER — Encounter: Payer: Self-pay | Admitting: Obstetrics and Gynecology

## 2023-04-27 DIAGNOSIS — N6001 Solitary cyst of right breast: Secondary | ICD-10-CM

## 2023-04-27 DIAGNOSIS — N6002 Solitary cyst of left breast: Secondary | ICD-10-CM

## 2023-05-06 ENCOUNTER — Encounter

## 2023-05-06 ENCOUNTER — Other Ambulatory Visit

## 2023-05-18 ENCOUNTER — Other Ambulatory Visit: Payer: Self-pay | Admitting: Surgery

## 2023-05-18 DIAGNOSIS — N6001 Solitary cyst of right breast: Secondary | ICD-10-CM

## 2023-06-09 ENCOUNTER — Ambulatory Visit
Admission: RE | Admit: 2023-06-09 | Discharge: 2023-06-09 | Disposition: A | Discharge status: Home / Self Care | Source: Ambulatory Visit | Attending: Surgery | Admitting: Surgery

## 2023-06-09 DIAGNOSIS — N6001 Solitary cyst of right breast: Secondary | ICD-10-CM

## 2023-08-03 ENCOUNTER — Encounter: Payer: Self-pay | Admitting: Podiatry

## 2023-08-03 ENCOUNTER — Ambulatory Visit: Admitting: Podiatry

## 2023-08-03 DIAGNOSIS — L603 Nail dystrophy: Secondary | ICD-10-CM | POA: Diagnosis not present

## 2023-08-03 NOTE — Progress Notes (Signed)
  Subjective:  Patient ID: Lisa Strong, female    DOB: 1980-01-18,  MRN: [id number] HPI Chief Complaint  Patient presents with   Nail Problem    1st and 4th toenails bilateral - thick, darkened nails x years, also developing corns 4th toes, concerned about worsening   New Patient (Initial Visit)    Est pt 2019    43 y.o. female presents with the above complaint.   ROS: Denies fever chills nausea mobic muscle aches pains calf pain back pain chest pain shortness of breath  Past Medical History:  Diagnosis Date   Allergy    Asthma    Depression    Herpes simplex type 1 infection    Hydradenitis    Past Surgical History:  Procedure Laterality Date   Broken finger     WISDOM TOOTH EXTRACTION     No current outpatient medications on file.  Allergies  Allergen Reactions   Gramineae Pollens Other (See Comments)   Uncaria Tomentosa (Cats Claw) Other (See Comments)   Bee Pollen    Review of Systems Objective:  There were no vitals filed for this visit.  General: Well developed, nourished, in no acute distress, alert and oriented x3   Dermatological: Skin is warm, dry and supple bilateral. Nails x 10 are well maintained; remaining integument appears unremarkable at this time. There are no open sores, no preulcerative lesions, no rash or signs of infection present.  Vascular: Dorsalis Pedis artery and Posterior Tibial artery pedal pulses are 2/4 bilateral with immedate capillary fill time. Pedal hair growth present. No varicosities and no lower extremity edema present bilateral.  Mild mallet toe deformities resulting in reactive hyperkeratotic lesion overlying the dorsal lateral DIPJ fourth toes bilateral.  Also her toenails are starting to demonstrate some thickening with subungual debris and melanotic streaking.  Neruologic: Grossly intact via light touch bilateral. Vibratory intact via tuning fork bilateral. Protective threshold with Semmes Wienstein monofilament intact to all  pedal sites bilateral. Patellar and Achilles deep tendon reflexes 2+ bilateral. No Babinski or clonus noted bilateral.   Musculoskeletal: No gross boney pedal deformities bilateral. No pain, crepitus, or limitation noted with foot and ankle range of motion bilateral. Muscular strength 5/5 in all groups tested bilateral.  Mild mallet toe deformity fourth digits bilateral with mild osteoarthritic changes at the PIPJ's 2nd, 3rd and 4th toes.  Gait: Unassisted, Nonantalgic.    Radiographs:  None taken  Assessment & Plan:   Assessment: Mild mallet toe deformity fourth bilateral and mild nail dystrophy's multiple nail plates.  Plan: Samples of the skin and nail were taken today for pathologic evaluation we will follow-up with me on an as-needed basis.  Did discuss in detail DIPJ flexor tenotomy's.  She may consider this.     Mekhi Sonn T. [place], [place]

## 2023-08-18 ENCOUNTER — Ambulatory Visit: Payer: Self-pay | Admitting: Podiatry

## 2023-08-18 NOTE — Progress Notes (Signed)
 Spoke with patient went through results and recommendations.

## 2023-08-24 ENCOUNTER — Telehealth: Payer: Self-pay | Admitting: Podiatry

## 2023-08-24 NOTE — Telephone Encounter (Signed)
 Called and left message for patient to call office to confirm appointment cancellation-left message on machine

## 2023-08-31 ENCOUNTER — Ambulatory Visit: Admitting: Podiatry
